# Patient Record
Sex: Male | Born: 1970 | Race: White | Hispanic: No | Marital: Married
Health system: Southern US, Community
[De-identification: ages and names within clinical notes are randomized; demographics above are authoritative.]

---

## 2008-06-16 ENCOUNTER — Emergency Department: Payer: Self-pay | Admitting: Internal Medicine

## 2010-04-01 IMAGING — CT CT HEAD WITHOUT CONTRAST
2 series · 16 of 30 positions shown, 20 images · non-contrast
Comparison: none

REASON FOR EXAM: R/O TIA
COMMENTS:

[Series 2: without · axial · non-contrast · 0.43mm/px · z∈[-602,-477]mm · 13 of 31 slices shown, 17 images]
[im 3/31  brain]
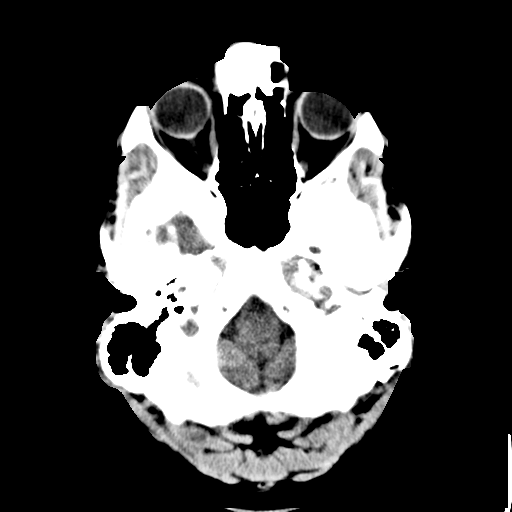
[im 3/31  bone]
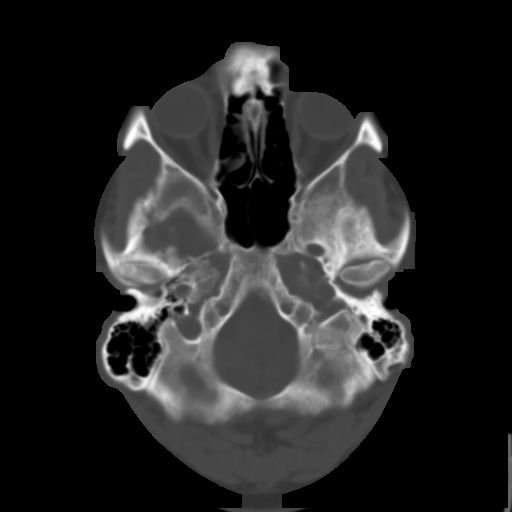
[im 5/31  brain]
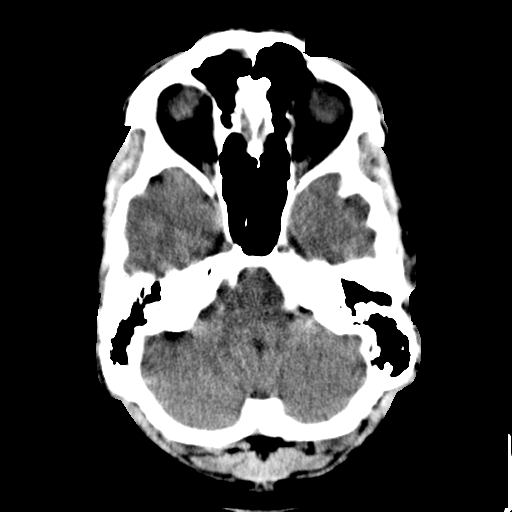
[im 7/31  brain]
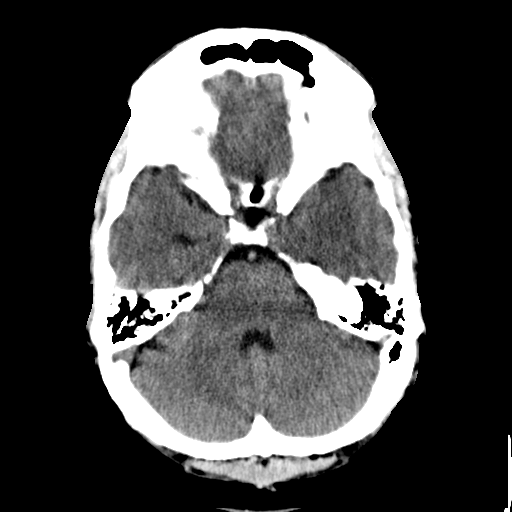
[im 9/31  brain]
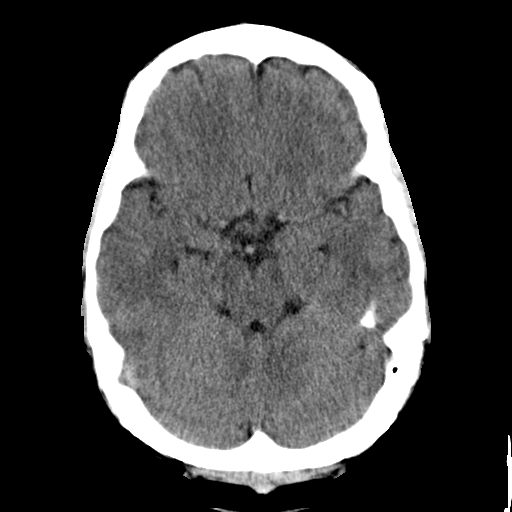
[im 11/31  brain]
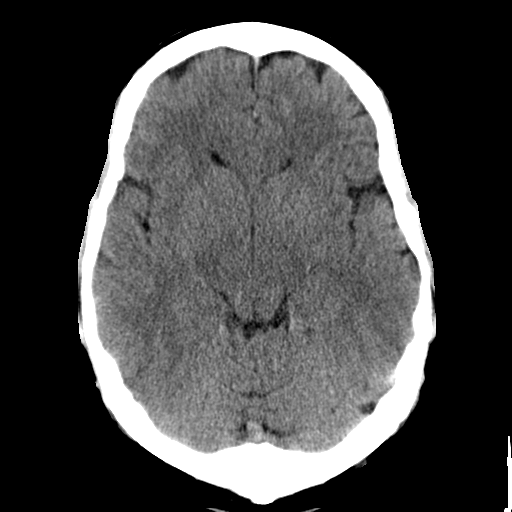
[im 11/31  bone]
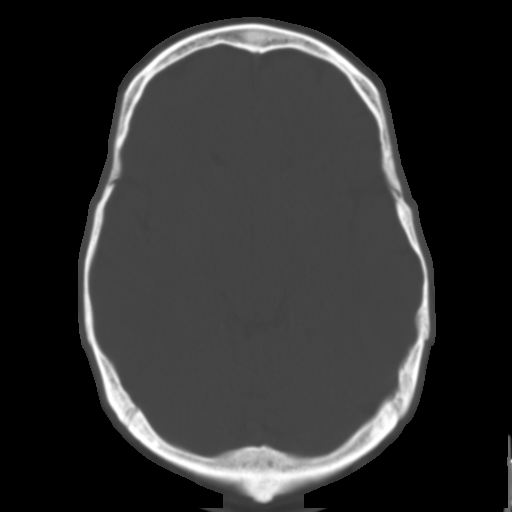
[im 13/31  brain]
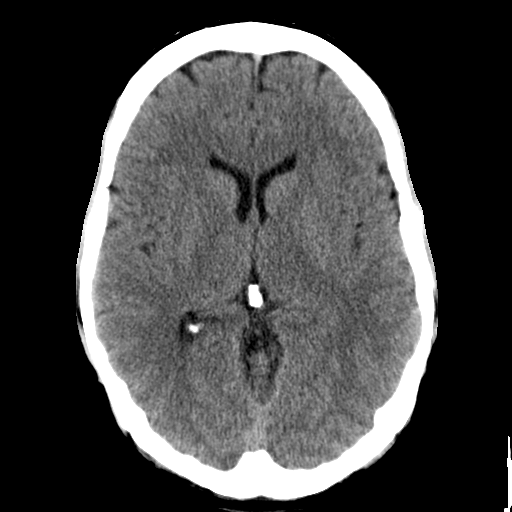
[im 16/31  brain]
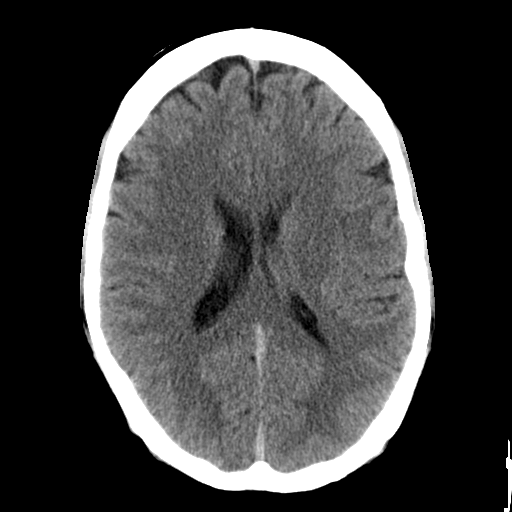
[im 18/31  brain]
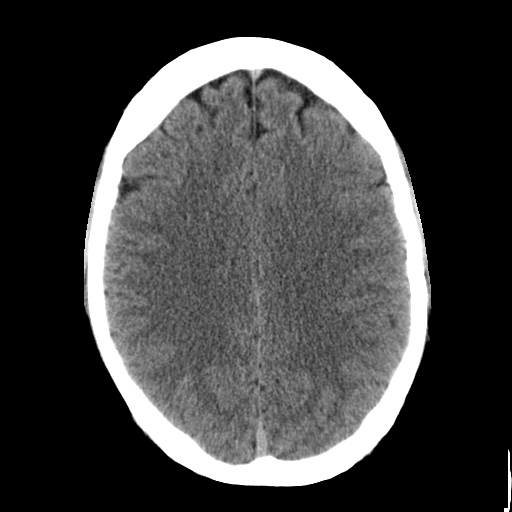
[im 20/31  brain]
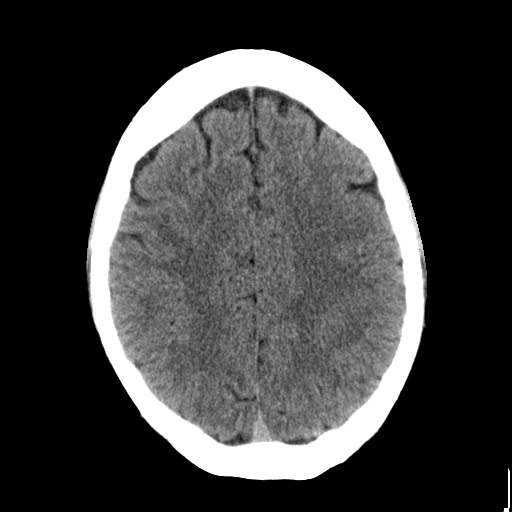
[im 20/31  bone]
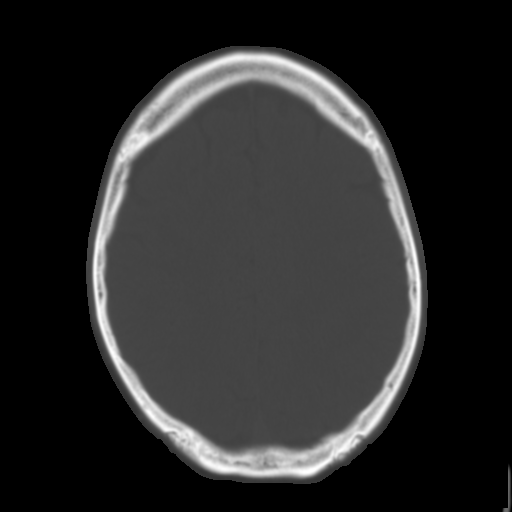
[im 22/31  brain]
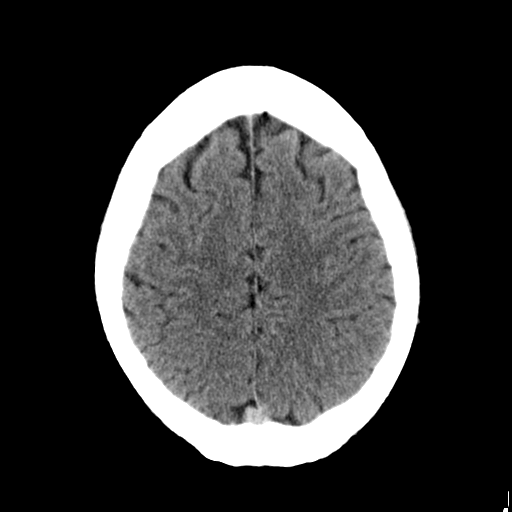
[im 24/31  brain]
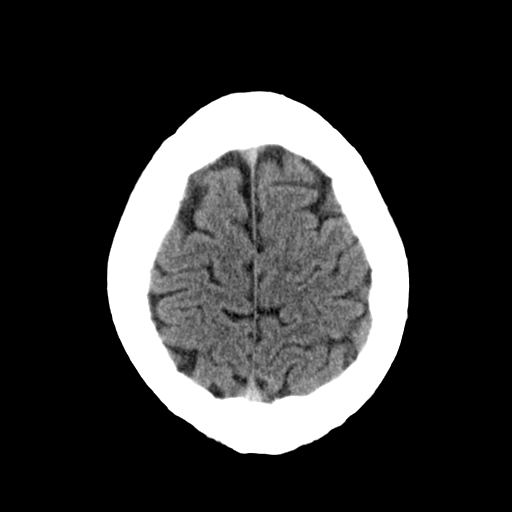
[im 26/31  brain]
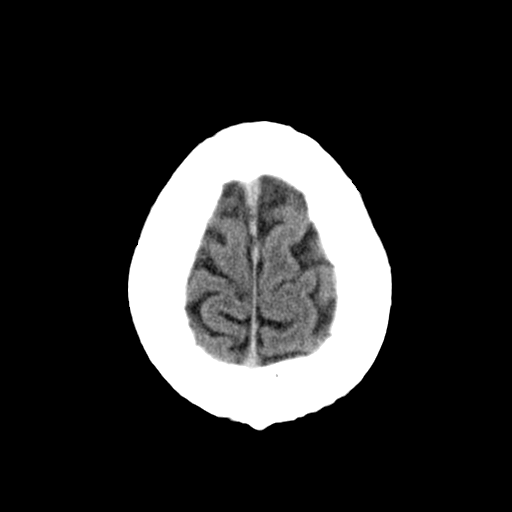
[im 28/31  brain]
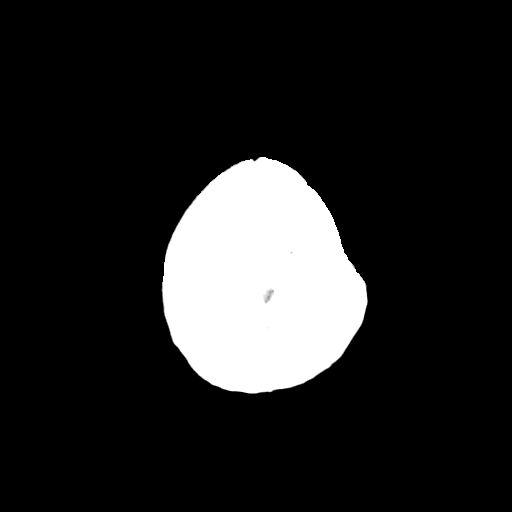
[im 28/31  bone]
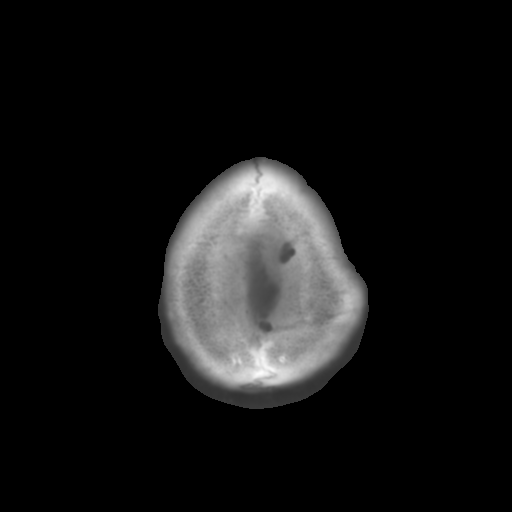

[Series 3: bone · axial · 0.43mm/px · z∈[-602,-562]mm · 3 of 31 slices shown]
[im 3/31  bone]
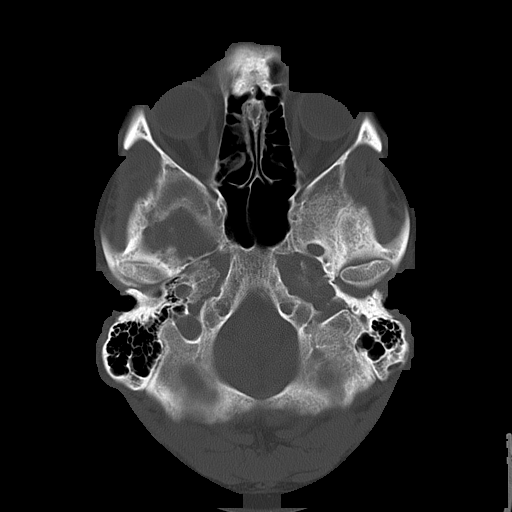
[im 7/31  bone]
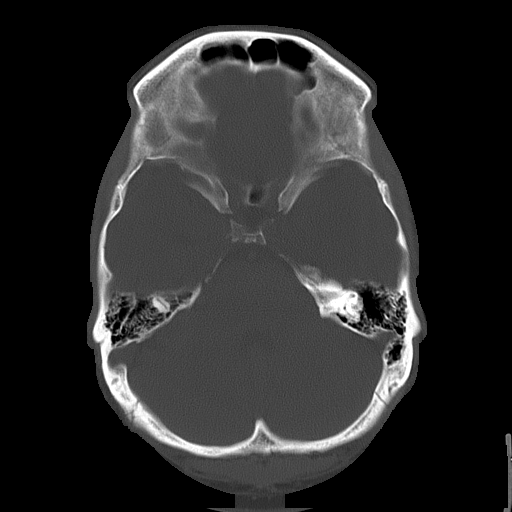
[im 11/31  bone]
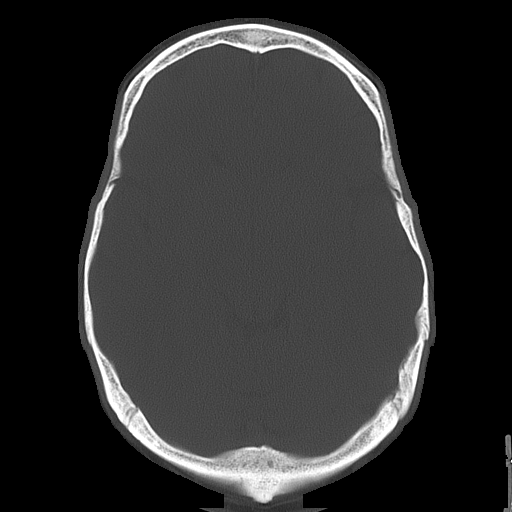

[16 of 30 positions shown; findings below may reference images not displayed]

PROCEDURE:     CT  - CT HEAD WITHOUT CONTRAST  - June 16, 2008  [DATE]

RESULT:     Noncontrast emergent CT of the brain is performed. The patient
has no previous exam for comparison.

The maxillary sinuses are not included. There is partial opacification of
the ethmoid sinuses. The remaining sinuses demonstrate minimal mucous
retention cyst in the right frontal region with normal aeration otherwise as
demonstrated in the mastoids. The calvarium is intact. The ventricles and
sulci are normal. There is no parenchymal or extra-axial hemorrhage. There
is no mass effect or midline shift. There is no territorial infarct.
IMPRESSION: Partial opacification of the ethmoid sinuses. Mucous
retention cysts in the right frontal sinus. These are small.
No acute intracranial abnormality.

## 2010-04-01 IMAGING — CT CT CHEST W/ CM
2 series · 15 of 32 positions shown, 19 images · IV contrast (APPLIED)
Comparison: none

REASON FOR EXAM: enlarged l hilum
COMMENTS:

[Series 4: soft tissue · axial · 0.85mm/px · 1 of 116 slices shown]
[im 9/116  mediastinal]
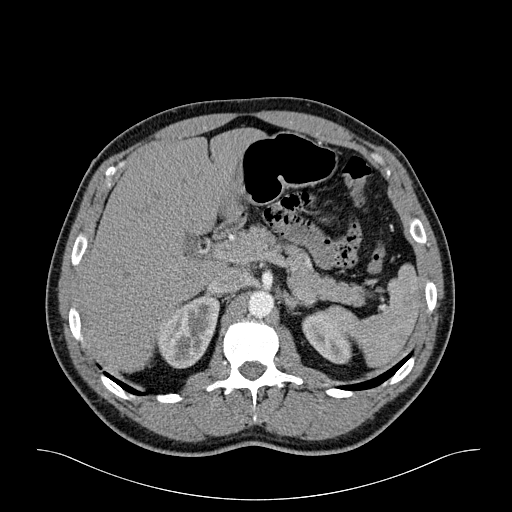

[Series 5: lung windows · axial · 0.85mm/px · z∈[-1208,-920]mm · 14 of 114 slices shown, 18 images]
[im 9/114  mediastinal]
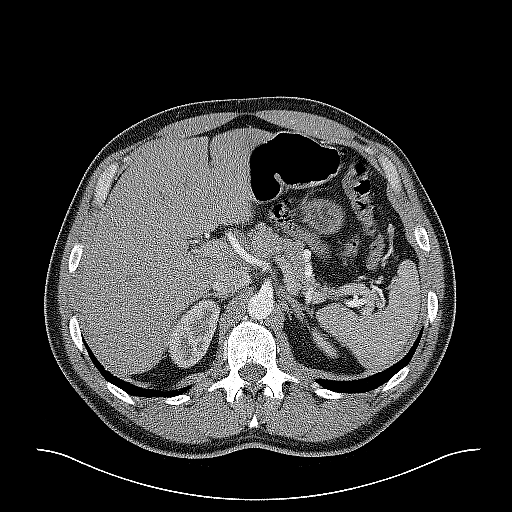
[im 9/114  lung]
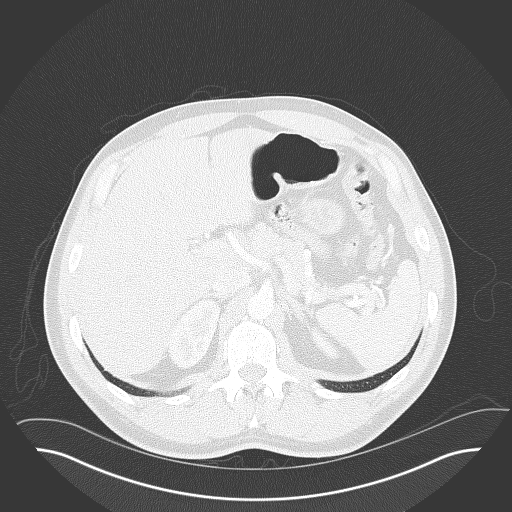
[im 18/114  lung]
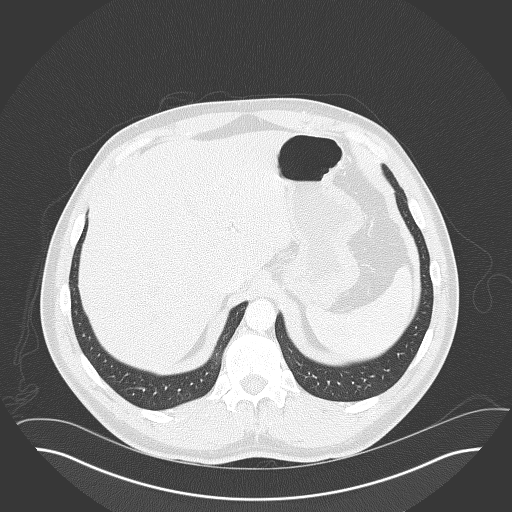
[im 27/114  lung]
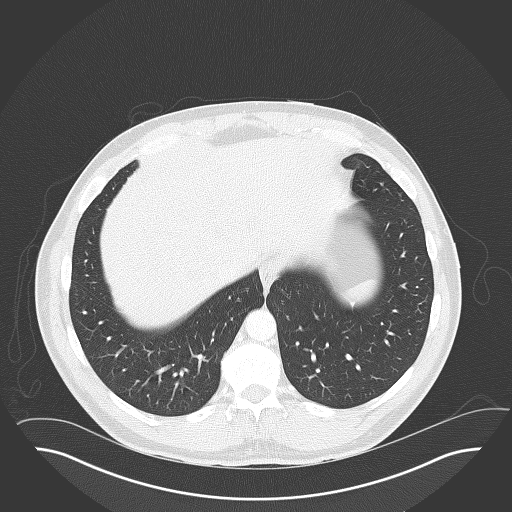
[im 35/114  lung]
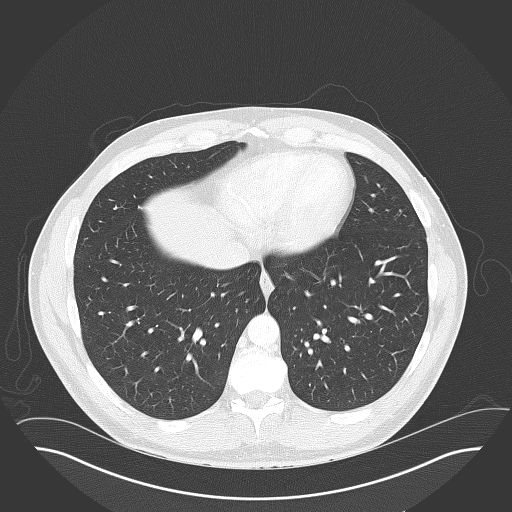
[im 44/114  mediastinal]
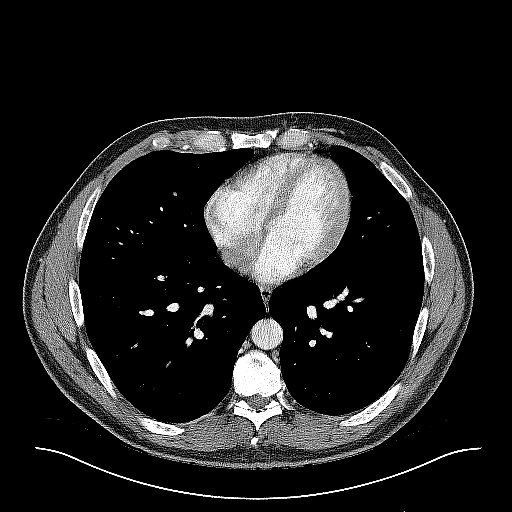
[im 44/114  lung]
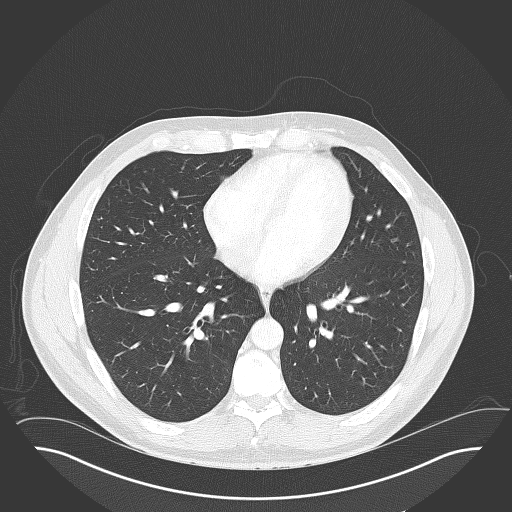
[im 53/114  lung]
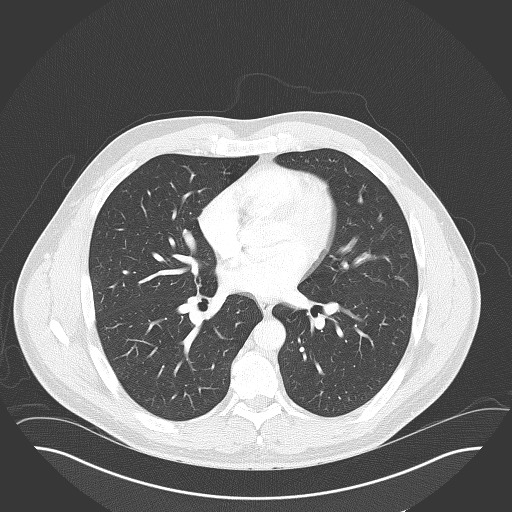
[im 54/114  lung]
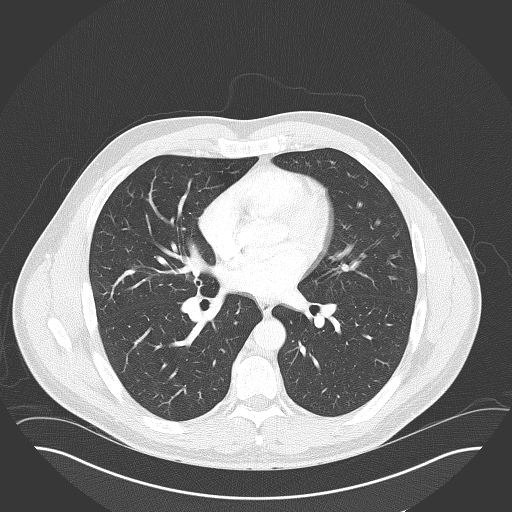
[im 57/114  lung]
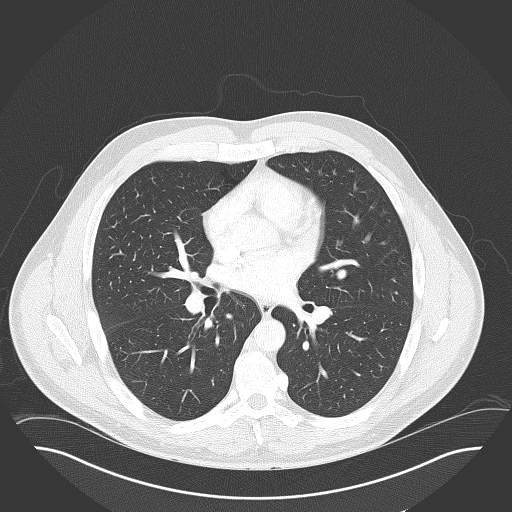
[im 61/114  mediastinal]
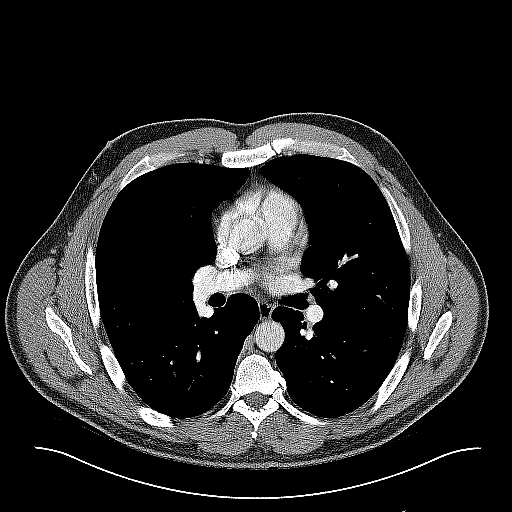
[im 61/114  lung]
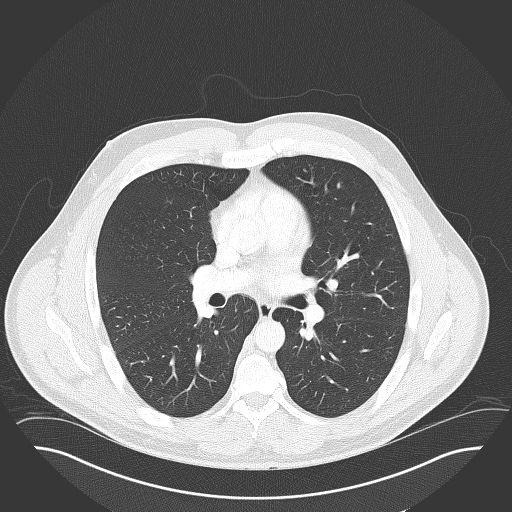
[im 70/114  lung]
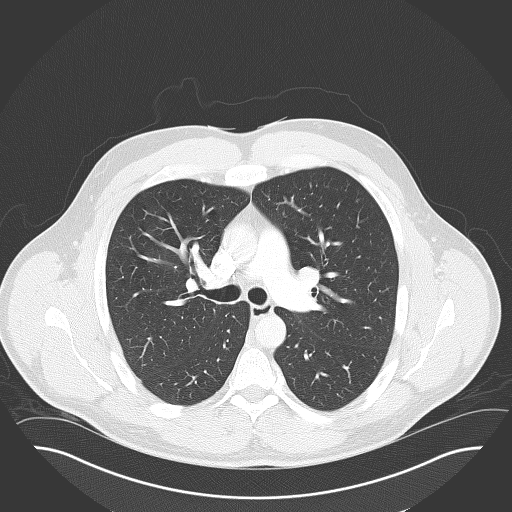
[im 79/114  lung]
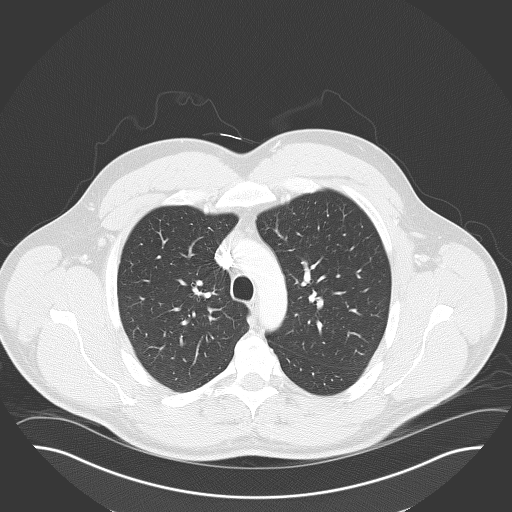
[im 87/114  lung]
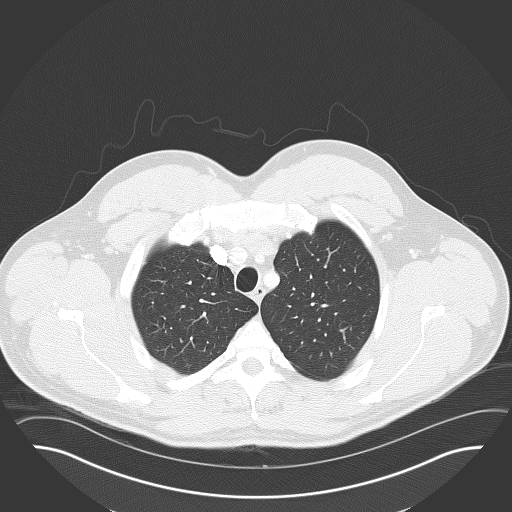
[im 96/114  mediastinal]
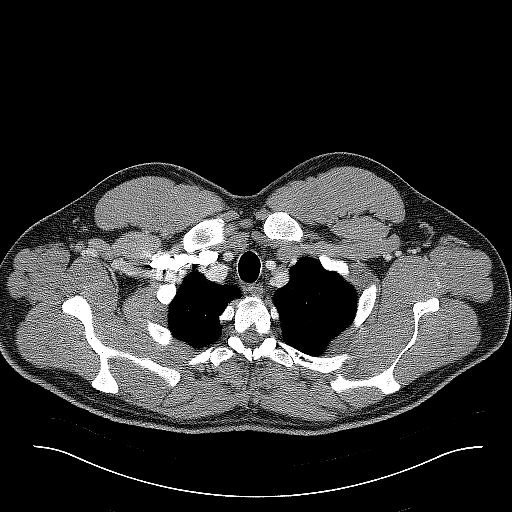
[im 96/114  lung]
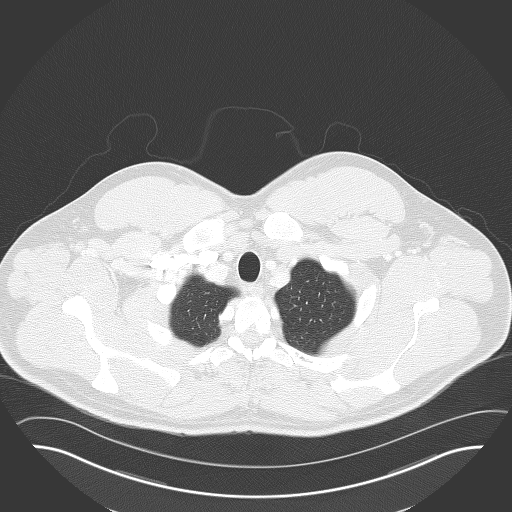
[im 105/114  lung]
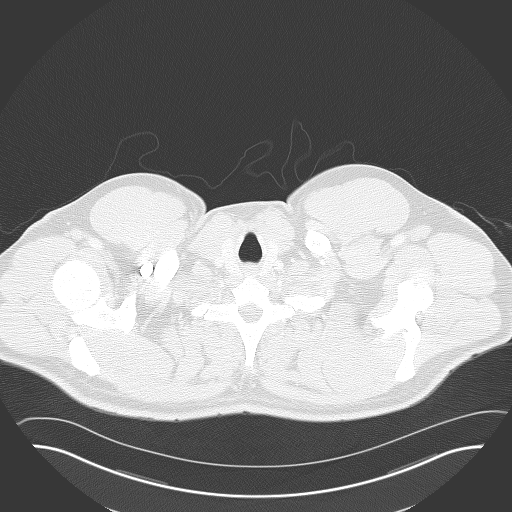

[15 of 32 positions shown; findings below may reference images not displayed]

PROCEDURE:     CT  - CT CHEST (FOR PE) W  - June 16, 2008  [DATE]

RESULT:     Emergent CT of the chest is performed utilizing a pulmonary
embolism technique with 85 mL of [id number] iodinated intravenous contrast.
Images are reconstructed in the axial plane at 3 mm slice thickness. There
is no previous CT for comparison.

The thoracic aorta enhances normally with no aneurysm or dissection. The
pulmonary arteries demonstrate no filling defect. There is no infiltrate,
pleural effusion or pericardial effusion. The upper abdominal structures
included on the exam appear unremarkable. There is no cardiomegaly. There is
no pulmonary nodule or mass. There is no mediastinal, hilar or axillary mass
or adenopathy.
IMPRESSION: 1. No CT evidence of pulmonary embolism or thoracic aortic dissection. There
are minimal degenerative changes in the spine.
2. There is no hilar mass or adenopathy.

## 2010-04-01 IMAGING — CR DG CHEST 1V PORT
1 series · 1 of 1 positions shown · non-contrast
Comparison: none

REASON FOR EXAM: R/O TIA
COMMENTS:

[view not recorded]
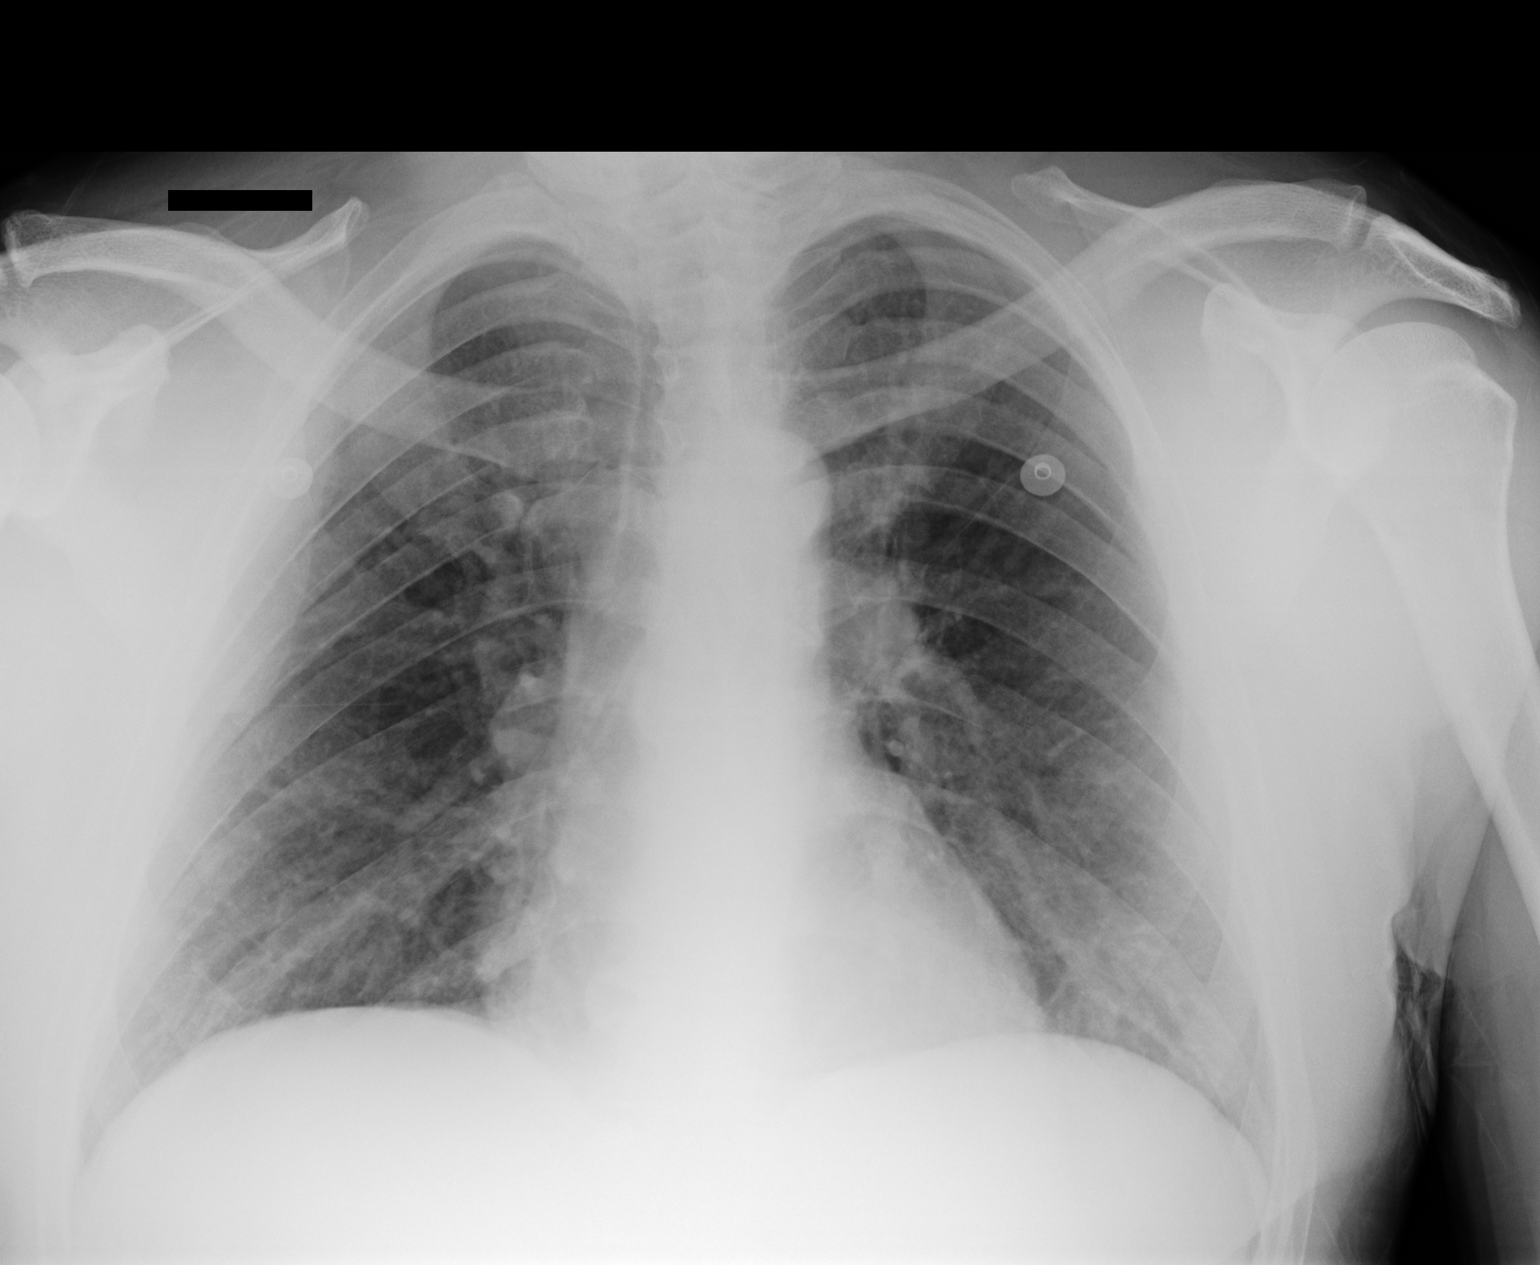

[1 of 1 positions shown; findings below may reference images not displayed]

PROCEDURE:     DXR - DXR PORTABLE CHEST SINGLE VIEW  - June 16, 2008  [DATE]

RESULT:     There are no prior chest radiographs for comparison. The lung
fields peripherally are clear of infiltrate. There is noted prominence of
the left hilum. The finding is nonspecific and could be secondary to a
prominent pulmonary artery, left hilar adenopathy or left hilar mass. It is
recommended the patient either have follow-up PA and lateral chest
radiographs or Chest CT for further evaluation.

Heart size is normal. No pleural effusion or pneumothorax is seen. The
mediastinal and osseous structures show no significant abnormalities.
IMPRESSION: 1. Prominent left hilum as noted above.
2. The peripheral lung fields are clear.

## 2019-04-19 ENCOUNTER — Ambulatory Visit: Payer: Self-pay | Attending: Internal Medicine

## 2019-04-19 DIAGNOSIS — Z23 Encounter for immunization: Secondary | ICD-10-CM | POA: Insufficient documentation

## 2019-04-19 NOTE — Progress Notes (Signed)
   Covid-[facility] Vaccination [facility]  Name:  Omar Robinson    MRN: [id number] DOB: 12-12-1970  04/19/2019  Mr. [name] was observed post Covid-19 immunization for 15 minutes without incidence. He was provided with Vaccine Information Sheet and instruction to access the V-Safe system.   Mr. [name] was instructed to call 911 with any severe reactions post vaccine: Marland Kitchen Difficulty breathing  . Swelling of your face and throat  . A fast heartbeat  . A bad rash all over your body  . Dizziness and weakness    Immunizations Administered    Name Date Dose VIS Date Route   Moderna COVID-19 Vaccine 04/19/2019  1:36 PM 0.5 mL 01/21/2019 Intramuscular   Manufacturer: Moderna   Lot: [id number]   NDC: [id number]

## 2019-05-17 ENCOUNTER — Ambulatory Visit: Payer: Self-pay | Attending: Internal Medicine

## 2019-05-17 DIAGNOSIS — Z23 Encounter for immunization: Secondary | ICD-10-CM

## 2019-05-17 NOTE — Progress Notes (Signed)
   Covid-19 Vaccination Clinic  Name:  Khamani Fairley    MRN: [id number] DOB: 1970/12/29  05/17/2019  Mr. Dondero was observed post Covid-19 immunization for 15 minutes without incident. He was provided with Vaccine Information Sheet and instruction to access the V-Safe system.   Mr. Banks was instructed to call 911 with any severe reactions post vaccine: Marland Kitchen Difficulty breathing  . Swelling of face and throat  . A fast heartbeat  . A bad rash all over body  . Dizziness and weakness   Immunizations Administered    Name Date Dose VIS Date Route   Moderna COVID-19 Vaccine 05/17/2019 10:13 AM 0.5 mL 01/21/2019 Intramuscular   Manufacturer: Moderna   Lot: [id number]   NDC: 34373-578-97
